# Patient Record
Sex: Male | Born: 1960 | Race: White | Hispanic: No | Smoking: Never smoker
Health system: Southern US, Community
[De-identification: ages and names within clinical notes are randomized; demographics above are authoritative.]

## PROBLEM LIST (undated history)

## (undated) DIAGNOSIS — J3089 Other allergic rhinitis: Secondary | ICD-10-CM

## (undated) HISTORY — PX: ABDOMINAL SURGERY: SHX537

---

## 1998-12-21 ENCOUNTER — Ambulatory Visit (HOSPITAL_BASED_OUTPATIENT_CLINIC_OR_DEPARTMENT_OTHER): Admission: RE | Admit: 1998-12-21 | Discharge: 1998-12-21 | Payer: Self-pay | Admitting: Orthopedic Surgery

## 2003-05-06 ENCOUNTER — Emergency Department (HOSPITAL_COMMUNITY): Admission: EM | Admit: 2003-05-06 | Discharge: 2003-05-06 | Payer: Self-pay | Admitting: Emergency Medicine

## 2003-05-06 ENCOUNTER — Encounter: Payer: Self-pay | Admitting: Emergency Medicine

## 2003-05-06 ENCOUNTER — Encounter: Payer: Self-pay | Admitting: Orthopaedic Surgery

## 2004-01-15 ENCOUNTER — Emergency Department (HOSPITAL_COMMUNITY): Admission: EM | Admit: 2004-01-15 | Discharge: 2004-01-15 | Payer: Self-pay | Admitting: Emergency Medicine

## 2016-09-04 ENCOUNTER — Ambulatory Visit
Admission: RE | Admit: 2016-09-04 | Discharge: 2016-09-04 | Disposition: A | Discharge status: Home / Self Care | Payer: Commercial Managed Care - PPO | Source: Ambulatory Visit | Attending: Nurse Practitioner | Admitting: Nurse Practitioner

## 2016-09-04 ENCOUNTER — Other Ambulatory Visit: Payer: Self-pay | Admitting: Nurse Practitioner

## 2016-09-04 DIAGNOSIS — R1111 Vomiting without nausea: Secondary | ICD-10-CM

## 2016-09-04 DIAGNOSIS — R1084 Generalized abdominal pain: Secondary | ICD-10-CM

## 2016-09-04 MED ORDER — IOPAMIDOL (ISOVUE-300) INJECTION 61%
100.0000 mL | Freq: Once | INTRAVENOUS | Status: AC | PRN
  Administered 2016-09-04: 100 mL via INTRAVENOUS

## 2017-12-22 IMAGING — CT CT ABD-PELV W/ CM
1 of 3 series · 13 of 32 positions shown, 18 images · IV contrast (APPLIED)
Comparison: 03/24/2008

CLINICAL DATA: Epigastric pain starting yesterday nausea and
vomiting, bloating

EXAM:
CT ABDOMEN AND PELVIS WITH CONTRAST
TECHNIQUE: Multidetector CT imaging of the abdomen and pelvis was performed
using the standard protocol following bolus administration of
intravenous contrast.
CONTRAST:  100mL X4R20Q-SSS IOPAMIDOL (X4R20Q-SSS) INJECTION 61%

[Series 2: abd/pelvis w/cm · axial · 0.76mm/px · z∈[-505,-25]mm · 13 of 108 slices shown, 18 images]
[im 6/108  soft-tissue]
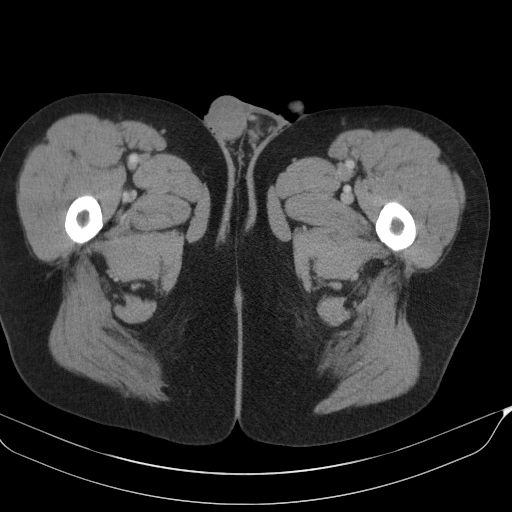
[im 6/108  bone]
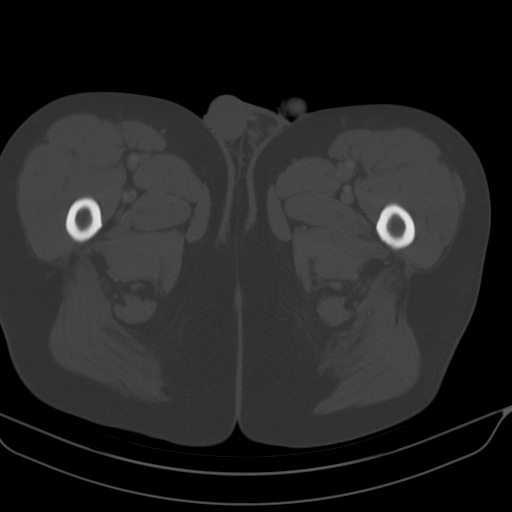
[im 17/108  soft-tissue]
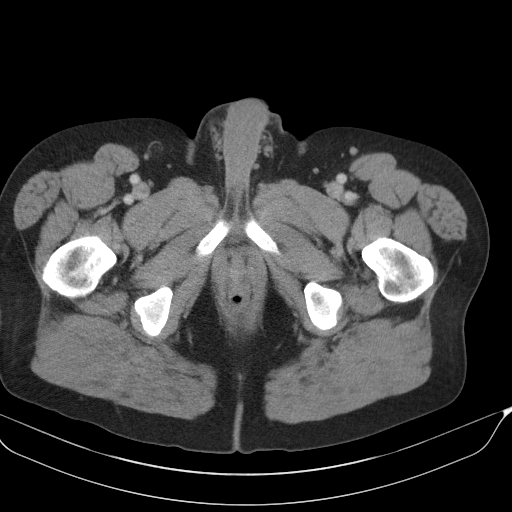
[im 22/108  soft-tissue]
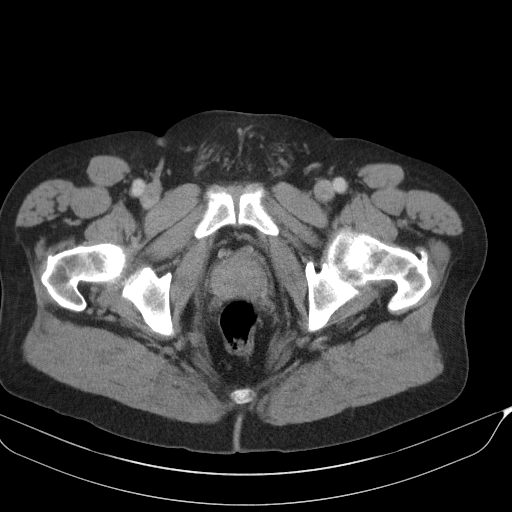
[im 33/108  soft-tissue]
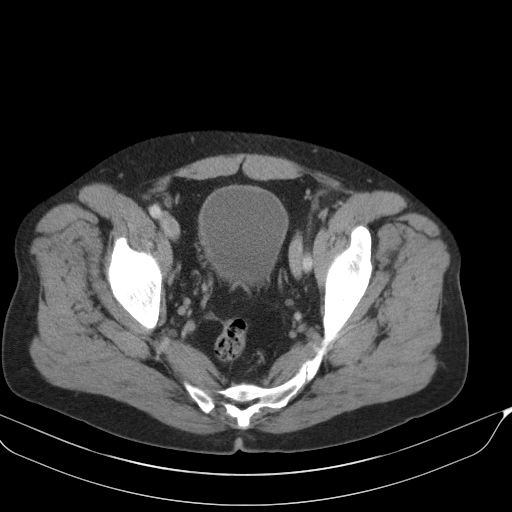
[im 43/108  soft-tissue]
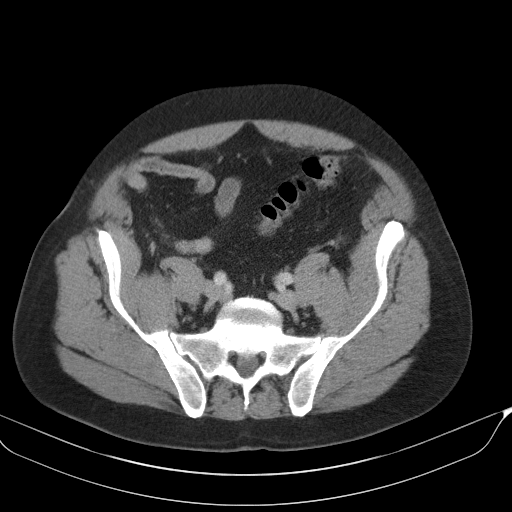
[im 49/108  soft-tissue]
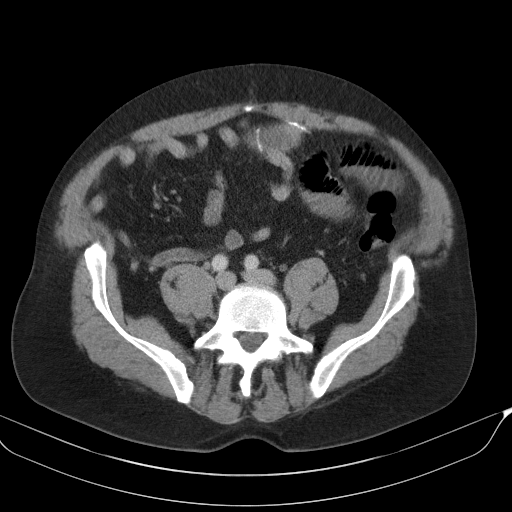
[im 59/108  soft-tissue]
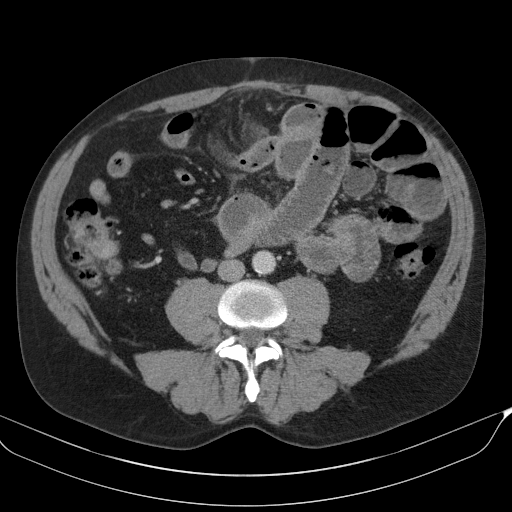
[im 65/108  soft-tissue]
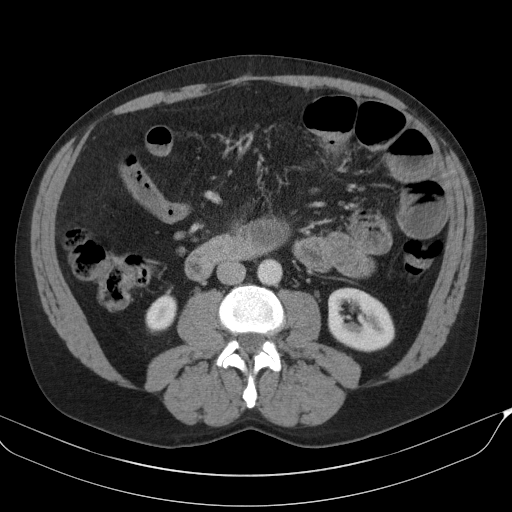
[im 75/108  soft-tissue]
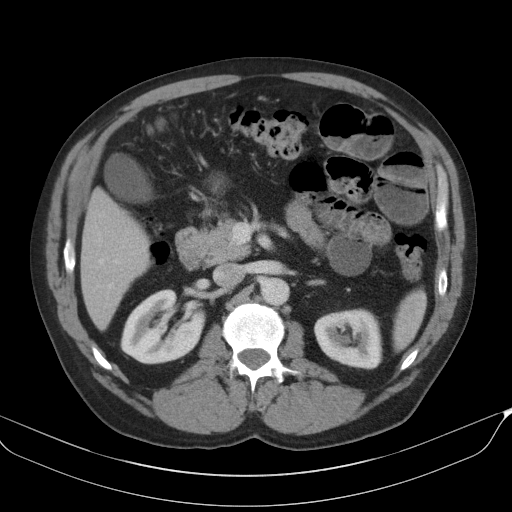
[im 75/108  bone]
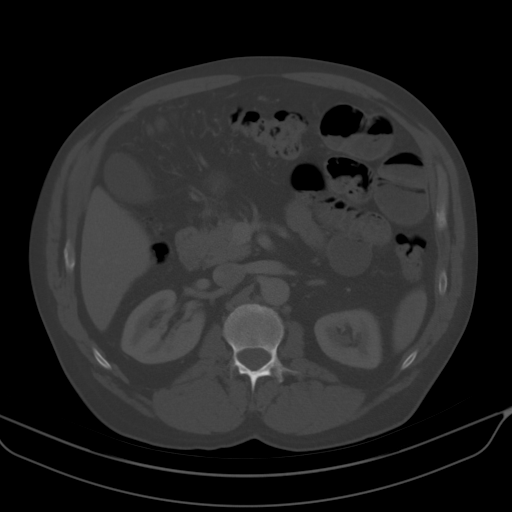
[im 86/108  soft-tissue]
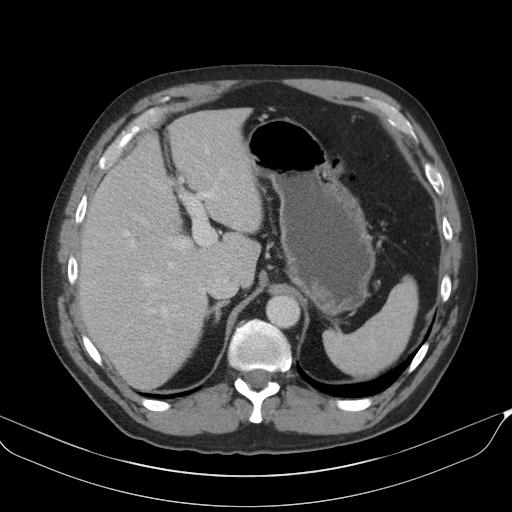
[im 86/108  lung]
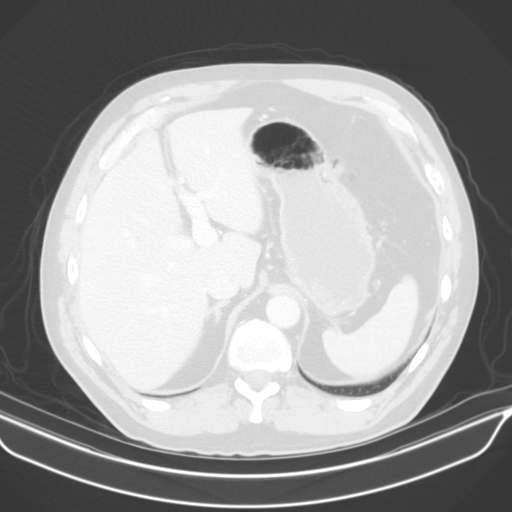
[im 91/108  soft-tissue]
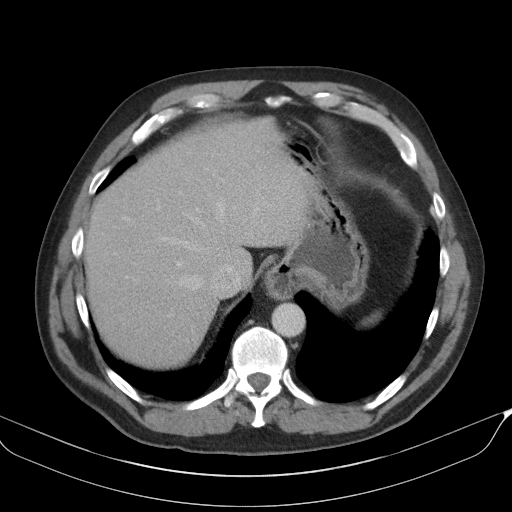
[im 91/108  lung]
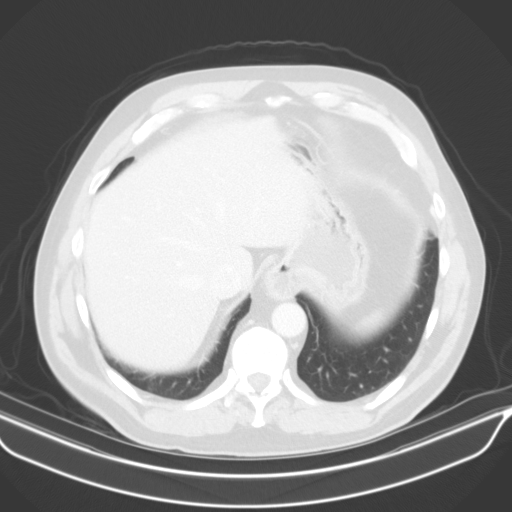
[im 97/108  lung]
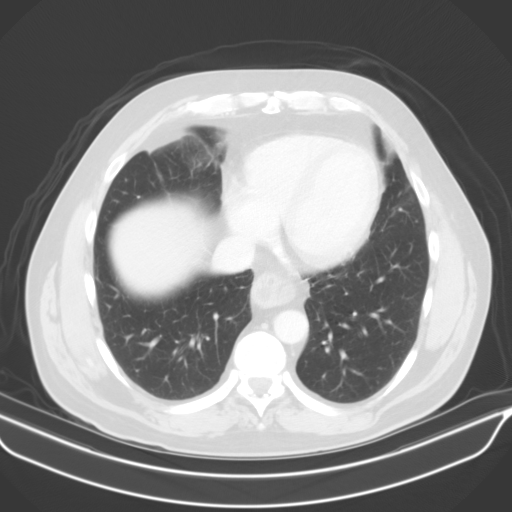
[im 102/108  soft-tissue]
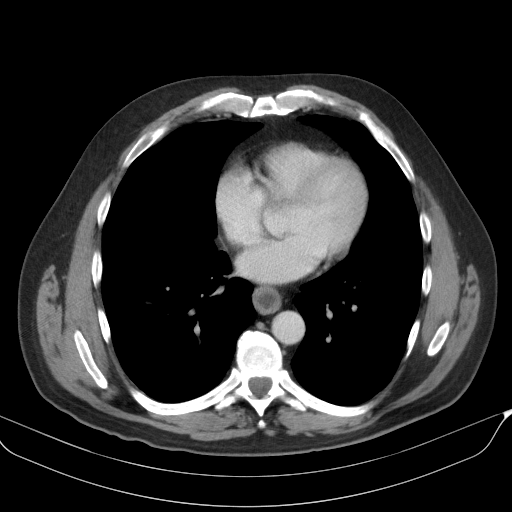
[im 102/108  lung]
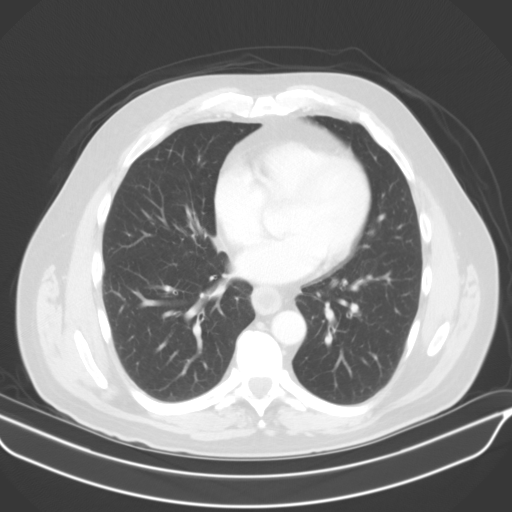

[13 of 32 positions shown; findings below may reference images not displayed]

FINDINGS: Lower chest: There is thickening of distal esophageal wall highly
suspicious for gastroesophageal reflux. Some fluid is noted in
distal esophagus. Small hiatal hernia is noted. The lung bases are
unremarkable.

Hepatobiliary: Enhanced liver is unremarkable. No calcified
gallstones are noted within gallbladder.

Pancreas: Enhanced pancreas is unremarkable.

Spleen: Enhanced spleen is unremarkable.

Adrenals/Urinary Tract: Adrenal glands are unremarkable. Kidneys are
normal, without renal calculi, focal lesion, or hydronephrosis.
Bladder is unremarkable.

Stomach/Bowel: No gastric outlet obstruction. There are dilated
small bowel loops in left abdomen with air-fluid levels.
Postsurgical changes are noted in small bowel mid abdomen at the
level of umbilicus with saccular dilatation at the level of
anastomosis measures 5.9 cm in diameter. Axial image 51 there is
transition point in caliber of small bowel wall. Findings are highly
suspicious for at least partial small bowel obstruction probable due
to adhesions. Distal small bowel is small caliber decompressed. No
pericecal inflammation. Normal appendix. Few colonic diverticula are
noted descending colon. No acute colitis or diverticulitis. No
distal colonic obstruction.

Vascular/Lymphatic: No aortic aneurysm. No retroperitoneal or
mesenteric adenopathy.

Reproductive: Prostate gland and seminal vesicles are unremarkable.

Other: No free abdominal air. Trace mesenteric fluid is noted in mid
mesentery axial image 50. No mesenteric abscess or perforation.
Delayed renal images shows bilateral renal symmetrical excretion.
Bilateral visualized proximal ureter is unremarkable.

Musculoskeletal: No destructive bony lesions are noted. Mild
degenerative changes lower thoracic spine.
IMPRESSION: 1. There are dilated small bowel loops in left abdomen with
air-fluid levels. Postsurgical changes are noted in small bowel mid
abdomen at the level of umbilicus with saccular dilatation at the
level of anastomosis measures 5.9 cm in diameter. Axial image 51
there is transition point in caliber of small bowel wall. Findings
are highly suspicious for at least partial small bowel obstruction
probable due to adhesions. Distal small bowel is small caliber
decompressed.
2. No hydronephrosis or hydroureter.
3. Normal appendix.  No pericecal inflammation.
4. No evidence of colitis or diverticulitis. No distal colonic
obstruction. No free abdominal air.

These results were called by telephone at the time of interpretation
on 09/04/2016 at [DATE] to Dr. KLEVER LOOR , who verbally
acknowledged these results.

## 2018-05-19 ENCOUNTER — Emergency Department (HOSPITAL_BASED_OUTPATIENT_CLINIC_OR_DEPARTMENT_OTHER)
Admission: EM | Admit: 2018-05-19 | Discharge: 2018-05-19 | Disposition: A | Discharge status: Home / Self Care | Payer: Commercial Managed Care - PPO | Attending: Emergency Medicine | Admitting: Emergency Medicine

## 2018-05-19 ENCOUNTER — Other Ambulatory Visit: Payer: Self-pay

## 2018-05-19 ENCOUNTER — Encounter (HOSPITAL_BASED_OUTPATIENT_CLINIC_OR_DEPARTMENT_OTHER): Payer: Self-pay

## 2018-05-19 DIAGNOSIS — S81802A Unspecified open wound, left lower leg, initial encounter: Secondary | ICD-10-CM | POA: Diagnosis not present

## 2018-05-19 DIAGNOSIS — Y9389 Activity, other specified: Secondary | ICD-10-CM | POA: Insufficient documentation

## 2018-05-19 DIAGNOSIS — Y999 Unspecified external cause status: Secondary | ICD-10-CM | POA: Diagnosis not present

## 2018-05-19 DIAGNOSIS — X58XXXA Exposure to other specified factors, initial encounter: Secondary | ICD-10-CM | POA: Diagnosis not present

## 2018-05-19 DIAGNOSIS — Y929 Unspecified place or not applicable: Secondary | ICD-10-CM | POA: Diagnosis not present

## 2018-05-19 HISTORY — DX: Other allergic rhinitis: J30.89

## 2018-05-19 MED ORDER — TRIAMCINOLONE ACETONIDE 0.1 % EX CREA: 1.0000 "application " | TOPICAL_CREAM | Freq: Two times a day (BID) | CUTANEOUS | 0 refills | Status: AC

## 2018-05-19 NOTE — ED Provider Notes (Signed)
[facility] EMERGENCY DEPARTMENT Provider Note   CSN: [id number] Arrival date & time: 05/19/18  1818     History   Chief Complaint Chief Complaint  Patient presents with  . Snake Bite    HPI Timothy Key is a 57 y.o. male with a history of seasonal environmental allergies who presents to the emergency department with a chief complaint of " I think I got bitten by a snake."  The patient states that he was outside approximately 1 hour prior to arrival removing the tarp from his grill when he felt a burning sensation to the back of his left leg.  He states that he continued removing the tarp from the grill and when he turned around he saw a copperhead snake curled up near his feet.  He states " it look like it was ready to strike, but I killed it."    He states that he did not see the snake bite him, but he is concerned about the red area on the back of his left leg.  He states that there is some pain to press directly over the middle of the area of redness, but is otherwise nontender.  States that he was wearing long, baggy pants at the time, but has since changed into shorts.  He did not bring the pants with him.  He reports that the area was still burning when he arrived to the emergency department, but has since resolved.  He denies any weakness, numbness, nausea, vomiting, abdominal pain, paresthesias, dizziness, blistering, swelling, or confusion.    No treatment prior to arrival.  The history is provided by the patient. No language interpreter was used.    Past Medical History:  Diagnosis Date  . Environmental and seasonal allergies     There are no active problems to display for this patient.   Past Surgical History:  Procedure Laterality Date  . ABDOMINAL SURGERY          Home Medications    Prior to Admission medications   Not on File    Family History No family history on file.  Social History Social History   Tobacco Use  . Smoking status:  Never Smoker  . Smokeless tobacco: Never Used  Substance Use Topics  . Alcohol use: Yes    Comment: daily  . Drug use: Never     Allergies   Sulfa antibiotics   Review of Systems Review of Systems  Constitutional: Negative for appetite change, chills and fever.  Respiratory: Negative for shortness of breath.   Cardiovascular: Negative for chest pain.  Gastrointestinal: Negative for abdominal pain.  Genitourinary: Negative for dysuria.  Musculoskeletal: Negative for back pain.  Skin: Positive for color change and wound. Negative for Liller.  Allergic/Immunologic: Negative for immunocompromised state.  Neurological: Negative for headaches.  Psychiatric/Behavioral: Negative for confusion.   Physical Exam Updated Vital Signs BP (!) 147/105 (BP Location: Right Arm)   Pulse 79   Temp 98.2 F (36.8 C) (Oral)   Resp 18   Ht  (1.727 m)   Wt 93 kg (205 lb)   SpO2 96%   BMI 31.17 kg/m   Physical Exam  Constitutional: He appears well-developed.  HENT:  Head: Normocephalic.  Eyes: Conjunctivae are normal.  Neck: Neck supple.  Cardiovascular: Normal rate and regular rhythm.  No murmur heard. Pulmonary/Chest: Effort normal.  Abdominal: Soft. He exhibits no distension.  Musculoskeletal: He exhibits no edema or deformity.  There is a 2.5 cm area of  redness to the posterior aspect of the left lower leg.  There is a pinpoint area where the top layer of the epidermis has been removed.  It does not appear to be consistent with a puncture wound.  Is more consistent with an abrasion.  There is minimal tenderness with palpation over the abrasion the erythematous area is otherwise nontender to palpation.  5 out of 5 strength against resistance with dorsiflexion plantarflexion.  DP PT pulses are 2+ and symmetric.  Sensation is intact and symmetric throughout the bilateral lower extremities.  Neurological: He is alert.  Skin: Skin is warm and dry.  Psychiatric: His behavior is normal.    Nursing note and vitals reviewed.        ED Treatments / Results  Labs (all labs ordered are listed, but only abnormal results are displayed) Labs Reviewed - No data to display  EKG None  Radiology No results found.  Procedures Procedures (including critical care time)  Medications Ordered in ED Medications - No data to display   Initial Impression / Assessment and Plan / ED Course  I have reviewed the triage vital signs and the nursing notes.  Pertinent labs & imaging results that were available during my care of the patient were reviewed by me and considered in my medical decision making (see chart for details).     57 year old male with a history of seasonal and environmental allergies presenting with concern for snakebite to the left lower leg.  He did not see the snake bite him, but saw a snake that he states was consistent with a copperhead located within a foot of where he was standing.  States he was wearing pants at the time that the wound occurred.  On exam, there is a 2.5 cm area of redness with a central lesion approximately 1 to 2 mm over the top layer of epidermis has been removed.  It does not appear consistent with a puncture wound.  He has no systemic symptoms including abdominal pain, confusion, nausea, vomiting, diarrhea.  There is minimal tenderness to palpation to the abrasion on the posterior left leg, but his exam is otherwise unremarkable.  Wound care and bacitracin applied in the ED.  He does state that the wound is pruritic.  Will provide him with a prescription of corticosteroid cream.  He was given strict return precautions to the emergency department.  He is hemodynamically stable and otherwise in no acute distress.  He is safe for discharge to home with outpatient follow-up to his PCP if symptoms do not improve.  Final Clinical Impressions(s) / ED Diagnoses   Final diagnoses:  None    ED Discharge Orders    None       Barkley Boards,  PA-C 05/19/18 Bishop Limbo, MD 05/27/18 1452

## 2018-05-19 NOTE — Discharge Instructions (Addendum)
Thank you for allowing me to care of you today in the emergency department.  Apply ice to the wound on her leg for 15 to 20 minutes up to 3-4 times per day.  You can take 650 mg of Tylenol or 600 mg of ibuprofen with food every 6 hours as needed for pain control.  You can apply a thin layer of triamcinolone cream to the area up to 2 times daily to help with pain, itching, and redness.  Clean the area with warm soap and water at least once daily.  You can also apply a topical antibiotic such as bacitracin to the area.  Return to the emergency department if you develop fever, Boehner, significantly worsening muscle pain, if you start developing easy bruising or bleeding, bleeding for your gums, or dark black or maroon stools, the wound becomes much more red, hot to the touch, if you develop red streaking or other concerning symptoms, please return to the emergency department for re-evaluation.

## 2018-05-19 NOTE — ED Triage Notes (Addendum)
Pt states he was outside at the grill approx 1 hour PTA-felt a burning to the back of left leg-turned around to find a copperhead near him that he killed-states he did not see snake strike him-pt has a red area to posterior lower left calf area-NAD-steady gait
